# Patient Record
Sex: Female | Born: 1975 | Race: White | Hispanic: No | Marital: Married | Smoking: Current every day smoker
Health system: Southern US, Community
[De-identification: ages and names within clinical notes are randomized; demographics above are authoritative.]

## PROBLEM LIST (undated history)

## (undated) HISTORY — PX: TUBAL LIGATION: SHX77

## (undated) HISTORY — PX: APPENDECTOMY: SHX54

---

## 2003-02-24 ENCOUNTER — Emergency Department (HOSPITAL_COMMUNITY): Admission: EM | Admit: 2003-02-24 | Discharge: 2003-02-24 | Payer: Self-pay | Admitting: Emergency Medicine

## 2007-08-27 ENCOUNTER — Emergency Department (HOSPITAL_COMMUNITY): Admission: EM | Admit: 2007-08-27 | Discharge: 2007-08-27 | Payer: Self-pay | Admitting: Emergency Medicine

## 2008-07-16 ENCOUNTER — Emergency Department (HOSPITAL_COMMUNITY): Admission: EM | Admit: 2008-07-16 | Discharge: 2008-07-16 | Payer: Self-pay | Admitting: Emergency Medicine

## 2015-03-08 ENCOUNTER — Encounter (HOSPITAL_COMMUNITY): Payer: Self-pay | Admitting: Emergency Medicine

## 2015-03-08 ENCOUNTER — Emergency Department (HOSPITAL_COMMUNITY)
Admission: EM | Admit: 2015-03-08 | Discharge: 2015-03-08 | Disposition: A | Discharge status: Home / Self Care | Payer: Self-pay | Attending: Emergency Medicine | Admitting: Emergency Medicine

## 2015-03-08 DIAGNOSIS — R51 Headache: Secondary | ICD-10-CM | POA: Insufficient documentation

## 2015-03-08 DIAGNOSIS — F17210 Nicotine dependence, cigarettes, uncomplicated: Secondary | ICD-10-CM | POA: Insufficient documentation

## 2015-03-08 DIAGNOSIS — R519 Headache, unspecified: Secondary | ICD-10-CM

## 2015-03-08 LAB — I-STAT CHEM 8, ED
BUN: 11 mg/dL (ref 6–20)
Calcium, Ion: 1.21 mmol/L (ref 1.12–1.23)
Chloride: 105 mmol/L (ref 101–111)
Creatinine, Ser: 0.7 mg/dL (ref 0.44–1.00)
Glucose, Bld: 82 mg/dL (ref 65–99)
HEMATOCRIT: 46 % (ref 36.0–46.0)
HEMOGLOBIN: 15.6 g/dL — AB (ref 12.0–15.0)
Potassium: 4.4 mmol/L (ref 3.5–5.1)
SODIUM: 141 mmol/L (ref 135–145)
TCO2: 22 mmol/L (ref 0–100)

## 2015-03-08 MED ORDER — METOCLOPRAMIDE HCL 5 MG/ML IJ SOLN
10.0000 mg | Freq: Once | INTRAMUSCULAR | Status: AC
  Administered 2015-03-08: 10 mg via INTRAVENOUS
  Filled 2015-03-08: qty 2

## 2015-03-08 MED ORDER — DIPHENHYDRAMINE HCL 50 MG/ML IJ SOLN
25.0000 mg | Freq: Once | INTRAMUSCULAR | Status: AC
  Administered 2015-03-08: 25 mg via INTRAVENOUS
  Filled 2015-03-08: qty 1

## 2015-03-08 MED ORDER — BUTALBITAL-APAP-CAFFEINE 50-325-40 MG PO TABS: 1.0000 | ORAL_TABLET | Freq: Four times a day (QID) | ORAL | Status: AC | PRN

## 2015-03-08 MED ORDER — SODIUM CHLORIDE 0.9 % IV BOLUS (SEPSIS)
1000.0000 mL | Freq: Once | INTRAVENOUS | Status: AC
  Administered 2015-03-08: 1000 mL via INTRAVENOUS

## 2015-03-08 MED ORDER — KETOROLAC TROMETHAMINE 30 MG/ML IJ SOLN
30.0000 mg | Freq: Once | INTRAMUSCULAR | Status: AC
Start: 2015-03-08 — End: 2015-03-08
  Administered 2015-03-08: 30 mg via INTRAVENOUS
  Filled 2015-03-08: qty 1

## 2015-03-08 NOTE — ED Notes (Signed)
Brought patient back to room; patient undressed, in gown, on continuous pulse oximetry and blood pressure cuff; visitor at bedside

## 2015-03-08 NOTE — ED Provider Notes (Signed)
CSN: [id number]     Arrival date & time 03/08/15  0804 History   First MD Initiated Contact with Patient 03/08/15 [phone]     Chief Complaint  Patient presents with  . Headache     (Consider location/radiation/quality/duration/timing/severity/associated sxs/prior Treatment) HPI  40 year old female presents with a progressively worsening headache for the past 3 days. Patient states that every once while she gets a mild headache that seems to go with Tylenol but only once before has she had a headache like this several years ago. She was diagnosed with cluster headaches at that time. Currently her headache was mild on onset and has just been progressively worsening. Currently is an 8/10. Feels he can comes from the back of her head/upper neck and radiates up to the front. Feels a pounding sensation. No neck stiffness. No fevers, vomiting, or blurry vision. No dizziness. Sitting up or leaning over seems to make the pain is worse, lying flat seems to make it better. Patient does have some photophobia. She does endorse that over these last couple days she has also quit [employer] cold [place]. She states she drinks "truckloads" the [facility] typically and is now trying to drink just water. She is not sure if this playing a role.  History reviewed. No pertinent past medical history. Past Surgical History  Procedure Laterality Date  . Appendectomy     No family history on file. Social History  Substance Use Topics  . Smoking status: Current Every Day Smoker -- 1.00 packs/day    Types: Cigarettes  . Smokeless tobacco: None  . Alcohol Use: No   OB History    No data available     Review of Systems  Constitutional: Negative for fever.  Eyes: Positive for photophobia. Negative for visual disturbance.  Gastrointestinal: Negative for nausea and vomiting.  Musculoskeletal: Negative for neck pain and neck stiffness.  Neurological: Positive for headaches. Negative for dizziness, weakness,  light-headedness and numbness.  All other systems reviewed and are negative.     Allergies  Review of patient's allergies indicates no known allergies.  Home Medications   Prior to Admission medications   Not on File   BP 96/58 mmHg  Pulse 58  Temp(Src) 97.4 F (36.3 C) (Oral)  Resp 16  SpO2 98%  LMP 02/22/2015 (Approximate) Physical Exam  Constitutional: She is oriented to person, place, and time. She appears well-developed and well-nourished.  HENT:  Head: Normocephalic and atraumatic.  Right Ear: External ear normal.  Left Ear: External ear normal.  Nose: Nose normal.  No scalp tenderness  Eyes: EOM are normal. Pupils are equal, round, and reactive to light. Right eye exhibits no discharge. Left eye exhibits no discharge.  Fundoscopic exam:      The right eye shows no papilledema.       The left eye shows no papilledema.  Neck: Full passive range of motion without pain. Neck supple. No spinous process tenderness and no muscular tenderness present. No rigidity.  Cardiovascular: Normal rate, regular rhythm and normal heart sounds.   Pulmonary/Chest: Effort normal and breath sounds normal.  Abdominal: Soft. There is no tenderness.  Neurological: She is alert and oriented to person, place, and time.  Reflex Scores:      Bicep reflexes are 2+ on the right side and 2+ on the left side.      Patellar reflexes are 2+ on the right side and 2+ on the left side. CN 2-12 grossly intact. 5/5 strength in all 4 extremities.  Grossly normal sensation. Normal finger to nose  Skin: Skin is warm and dry.  Nursing note and vitals reviewed.   ED Course  Procedures (including critical care time) Labs Review Labs Reviewed  I-STAT CHEM 8, ED - Abnormal; Notable for the following:    Hemoglobin 15.6 (*)    All other components within normal limits    Imaging Review No results found. I have personally reviewed and evaluated these images and lab results as part of my medical  decision-making.   EKG Interpretation None      MDM   Final diagnoses:  Occipital headache    Headache is nonspecific, no focal neuro findings. No meningismus. With gradual onset/worsening I have low suspicion for [facility]. No papilledema, doubt significant intracranial process. Could be related to recent abrupt caffeine stoppage after significant daily use. Will Rx fioricet, discharge with follow up with PCP. She feels improved. Discussed strict return precautions.    Pricilla Loveless, MD 03/08/15 [phone]

## 2015-03-08 NOTE — ED Notes (Signed)
Pt ambulated to the bathroom -- states headache better.

## 2015-03-08 NOTE — ED Notes (Signed)
Pt c/o headache that started on Monday-- has hx of "cluster headaches" but has not had one in "a long time" . States has had some sinus congestion and ears feel clogged also.

## 2018-04-29 ENCOUNTER — Other Ambulatory Visit: Payer: Self-pay

## 2018-04-29 ENCOUNTER — Encounter (HOSPITAL_COMMUNITY): Payer: Self-pay | Admitting: Emergency Medicine

## 2018-04-29 ENCOUNTER — Ambulatory Visit (HOSPITAL_COMMUNITY)
Admission: EM | Admit: 2018-04-29 | Discharge: 2018-04-29 | Disposition: A | Discharge status: Home / Self Care | Payer: Self-pay | Attending: Family Medicine | Admitting: Family Medicine

## 2018-04-29 DIAGNOSIS — B9789 Other viral agents as the cause of diseases classified elsewhere: Secondary | ICD-10-CM

## 2018-04-29 DIAGNOSIS — J069 Acute upper respiratory infection, unspecified: Secondary | ICD-10-CM | POA: Diagnosis present

## 2018-04-29 NOTE — ED Triage Notes (Signed)
Pt c/o congestion and cough since Monday. States her job wont let her come back until shes "sure i'm okay".

## 2018-04-29 NOTE — Discharge Instructions (Signed)
May try Flonase Hold benadryl

## 2018-04-29 NOTE — ED Provider Notes (Signed)
[facility]    CSN: [id number] Arrival date & time: 04/29/18  1020     History   Chief Complaint Chief Complaint  Patient presents with  . Cough    HPI Jill Powell is a 43 y.o. female.   Patient has had some cough that is primarily nonproductive.  Endorses postnasal drainage.  She has not had any fever shortness of breath or myalgias.  She has had no known exposures to covid 19 and has not traveled to [place] areas.  HPI  History reviewed. No pertinent past medical history.  Patient Active Problem List   Diagnosis Date Noted  . Viral URI with cough 04/29/2018    Past Surgical History:  Procedure Laterality Date  . APPENDECTOMY      OB History   No obstetric history on file.      Home Medications    Prior to Admission medications   Medication Sig Start Date End Date Taking? Authorizing Provider  acetaminophen (TYLENOL) 325 MG tablet Take 650 mg by mouth every 6 (six) hours as needed for mild pain.    [provider]    Family History No family history on file.  Social History Social History   Tobacco Use  . Smoking status: Current Every Day Smoker    Packs/day: 1.00    Types: Cigarettes  Substance Use Topics  . Alcohol use: No  . Drug use: No     Allergies   Patient has no known allergies.   Review of Systems Review of Systems  Constitutional: Negative for fever.  HENT: Positive for congestion.   Respiratory: Positive for cough.   All other systems reviewed and are negative.    Physical Exam Triage Vital Signs ED Triage Vitals  Enc Vitals Group     BP --      Pulse Rate 04/29/18 1108 (!) 56     Resp 04/29/18 1108 16     Temp 04/29/18 1108 98.4 F (36.9 C)     Temp src --      SpO2 04/29/18 1108 99 %     Weight --      Height --      Head Circumference --      Peak Flow --      Pain Score 04/29/18 1109 0     Pain Loc --      Pain Edu? --      Excl. in GC? --    No data found.  Updated Vital Signs  Pulse (!) 56   Temp 98.4 F (36.9 C)   Resp 16   LMP 04/26/2018   SpO2 99%   Visual Acuity Right Eye Distance:   Left Eye Distance:   Bilateral Distance:    Right Eye Near:   Left Eye Near:    Bilateral Near:     Physical Exam Constitutional:      Appearance: Normal appearance.  HENT:     Head: Normocephalic.     Right Ear: Tympanic membrane normal.     Left Ear: Tympanic membrane normal.     Nose: Nose normal.     Mouth/Throat:     Mouth: Mucous membranes are moist.     Pharynx: Oropharynx is clear.  Cardiovascular:     Rate and Rhythm: Normal rate and regular rhythm.     Pulses: Normal pulses.     Heart sounds: Normal heart sounds.  Pulmonary:     Effort: Pulmonary effort is normal.  Breath sounds: Normal breath sounds.  Neurological:     Mental Status: She is alert.      UC Treatments / Results  Labs (all labs ordered are listed, but only abnormal results are displayed) Labs Reviewed - No data to display  EKG None  Radiology No results found.  Procedures Procedures (including critical care time)  Medications Ordered in UC Medications - No data to display  Initial Impression / Assessment and Plan / UC Course  I have reviewed the triage vital signs and the nursing notes.  Pertinent labs & imaging results that were available during my care of the patient were reviewed by me and considered in my medical decision making (see chart for details).     Viral upper respiratory infection/allergies.  Hold Benadryl.  May try Mucinex OTC Final Clinical Impressions(s) / UC Diagnoses   Final diagnoses:  Viral URI with cough     Discharge Instructions     May try Flonase Hold benadryl   ED Prescriptions    None     Controlled Substance Prescriptions [place] Controlled Substance Registry consulted? No   Frederica Kuster, MD 04/29/18 1128

## 2019-03-04 ENCOUNTER — Ambulatory Visit: Payer: Self-pay | Attending: Internal Medicine

## 2019-03-04 DIAGNOSIS — Z20822 Contact with and (suspected) exposure to covid-19: Secondary | ICD-10-CM

## 2019-03-05 ENCOUNTER — Other Ambulatory Visit: Payer: Self-pay

## 2019-03-05 LAB — NOVEL CORONAVIRUS, NAA: SARS-CoV-2, NAA: NOT DETECTED

## 2019-03-05 LAB — SPECIMEN STATUS REPORT

## 2020-01-26 ENCOUNTER — Other Ambulatory Visit: Payer: Self-pay

## 2020-01-26 ENCOUNTER — Other Ambulatory Visit: Payer: Self-pay | Admitting: Obstetrics and Gynecology

## 2020-01-26 DIAGNOSIS — N6320 Unspecified lump in the left breast, unspecified quadrant: Secondary | ICD-10-CM

## 2020-02-01 ENCOUNTER — Ambulatory Visit: Payer: Self-pay | Admitting: *Deleted

## 2020-02-01 ENCOUNTER — Other Ambulatory Visit: Payer: Self-pay

## 2020-02-01 VITALS — BP 128/72 | Wt 137.0 lb

## 2020-02-01 DIAGNOSIS — N6321 Unspecified lump in the left breast, upper outer quadrant: Secondary | ICD-10-CM

## 2020-02-01 DIAGNOSIS — Z1239 Encounter for other screening for malignant neoplasm of breast: Secondary | ICD-10-CM

## 2020-02-01 NOTE — Patient Instructions (Addendum)
Explained breast self awareness with Deanza Muffley. Patient refused Pap smear today. Patient scheduled to come to the free cervical cancer screening on Monday, March 06, 2020 at 1030 for her Pap smear. Let her know [facility] will cover Pap smears every 3 years unless has a history of abnormal Pap smears. Referred patient to the [facility] for a diagnostic mammogram. Appointment scheduled Tuesday, February 08, 2020 at 0850. Patient aware of appointments and will be there. Discussed smoking cessation with patient. Referred to the [facility] Quitline and gave resources to the free smoking cessation classes at [facility]. Sayward Plitt verbalized understanding.  Yolander Goodie, Kathaleen Maser, RN 10:35 AM

## 2020-02-01 NOTE — Progress Notes (Signed)
Ms. Jill Powell is a 44 y.o. female who presents to [facility] clinic today with complaint of left breast lump x one week.    Pap Smear: Pap smear not completed today. Last Pap smear was around 14 years ago at a clinic in [facility] and was normal. Per patient has history of an abnormal Pap smear prior to her last Pap smear that the Pap smear above was a repeat Pap smear. Last Pap smear result is not available in Epic.   Physical exam: Breasts Breasts symmetrical. No skin abnormalities bilateral breasts. No nipple retraction bilateral breasts. No nipple discharge bilateral breasts. No lymphadenopathy. No lumps palpated right breast. Palpated a lump within the left breast between 1 o'clock and 3 o'clock 4 cm from the nipple. No complaints of pain or tenderness on exam.      Pelvic/Bimanual Patient refused Pap smear today. Patient scheduled to come to the free cervical cancer screening on Monday, March 06, 2020 at 1030 for her Pap smear.   Smoking History: Patient is a current smoker. Discussed smoking cessation with patient. Referred to the [facility] Quitline and gave resources to the free smoking cessation classes at [facility].   Patient Navigation: Patient education provided. Access to services provided for patient through [facility] program.    Breast and Cervical Cancer Risk Assessment: Patient does not have family history of breast cancer, known genetic mutations, or radiation treatment to the chest before age 2. Patient does not have history of cervical dysplasia, immunocompromised, or DES exposure in-utero.  Risk Assessment    Risk Scores      02/01/2020   Last edited by: Narda Rutherford, LPN   5-year risk: 0.6 %   Lifetime risk: 7.7 %          A: [facility] exam without pap smear Complaint of left breast lump.  P: Referred patient to the [facility] for a diagnostic mammogram. Appointment scheduled Tuesday, February 08, 2020 at 0850.  Priscille Heidelberg,  RN 02/01/2020 10:35 AM

## 2020-02-08 ENCOUNTER — Ambulatory Visit
Admission: RE | Admit: 2020-02-08 | Discharge: 2020-02-08 | Disposition: A | Discharge status: Home / Self Care | Payer: No Typology Code available for payment source | Source: Ambulatory Visit | Attending: Obstetrics and Gynecology | Admitting: Obstetrics and Gynecology

## 2020-02-08 ENCOUNTER — Other Ambulatory Visit: Payer: Self-pay

## 2020-02-08 ENCOUNTER — Other Ambulatory Visit: Payer: Self-pay | Admitting: Obstetrics and Gynecology

## 2020-02-08 DIAGNOSIS — N6320 Unspecified lump in the left breast, unspecified quadrant: Secondary | ICD-10-CM

## 2020-03-06 ENCOUNTER — Ambulatory Visit: Payer: Self-pay

## 2022-04-21 IMAGING — MG DIGITAL DIAGNOSTIC BILAT W/ TOMO W/ CAD
6 of 10 series · 6 of 30 positions shown · non-contrast
Comparison: None.

CLINICAL DATA: Patient presents for evaluation of palpable
abnormality within the outer left breast.

EXAM:
DIGITAL DIAGNOSTIC BILATERAL MAMMOGRAM WITH CAD AND TOMO
ULTRASOUND BILATERAL BREAST

[L CC synth-2D]
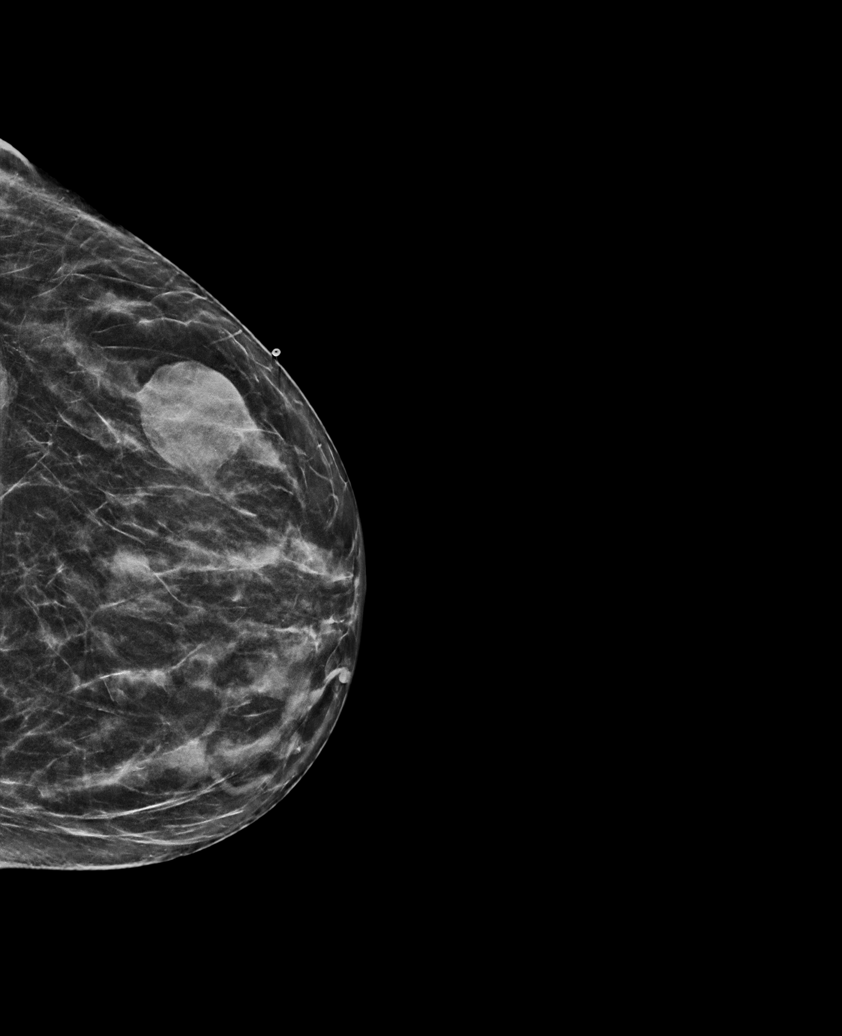

[L MLO synth-2D]
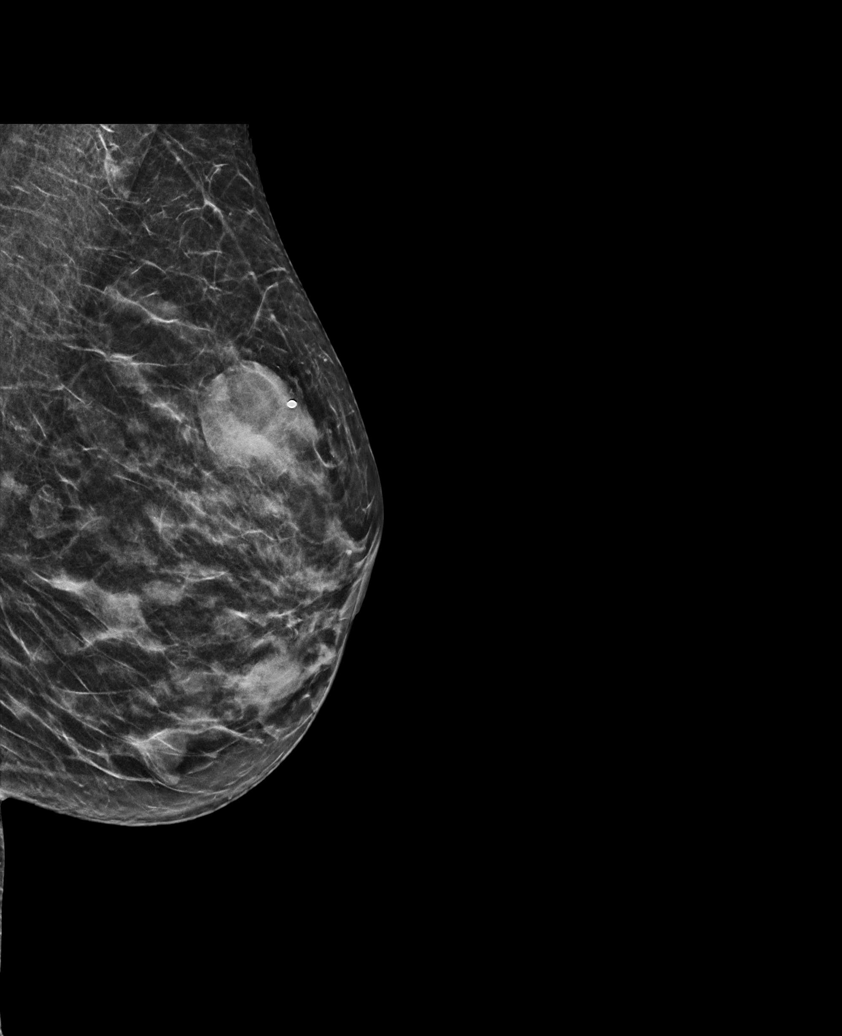

[R MLO synth-2D]
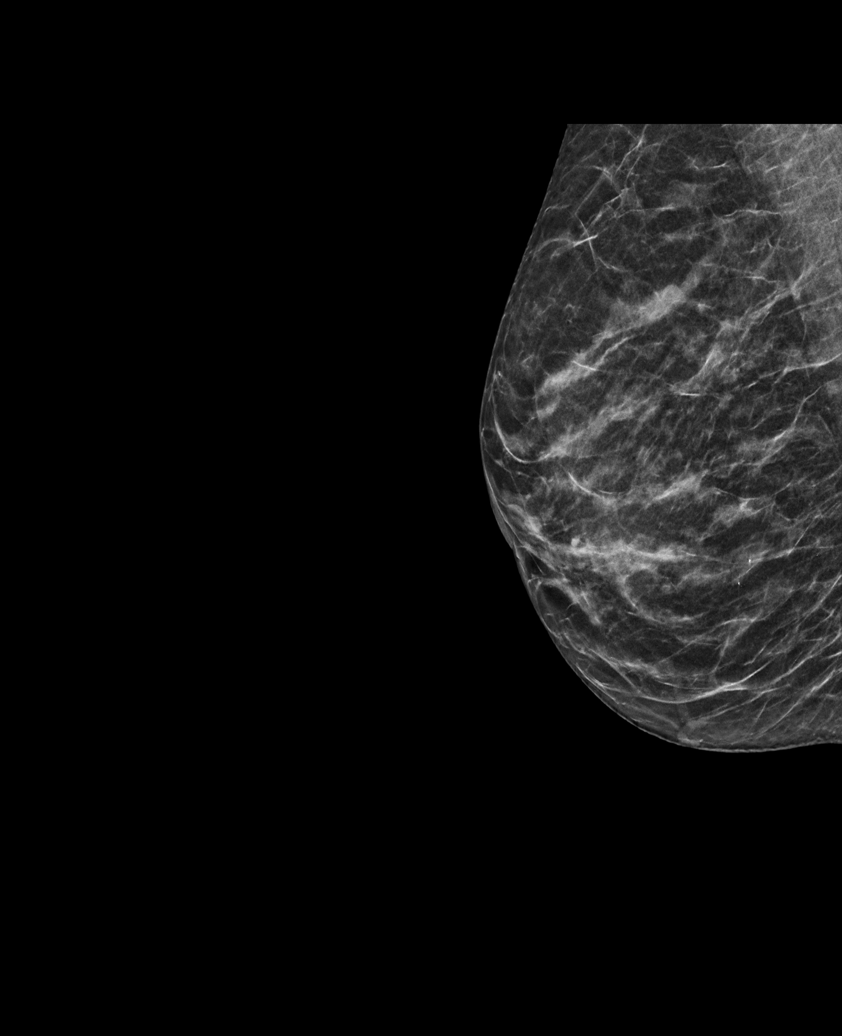

[L TAN synth-2D]
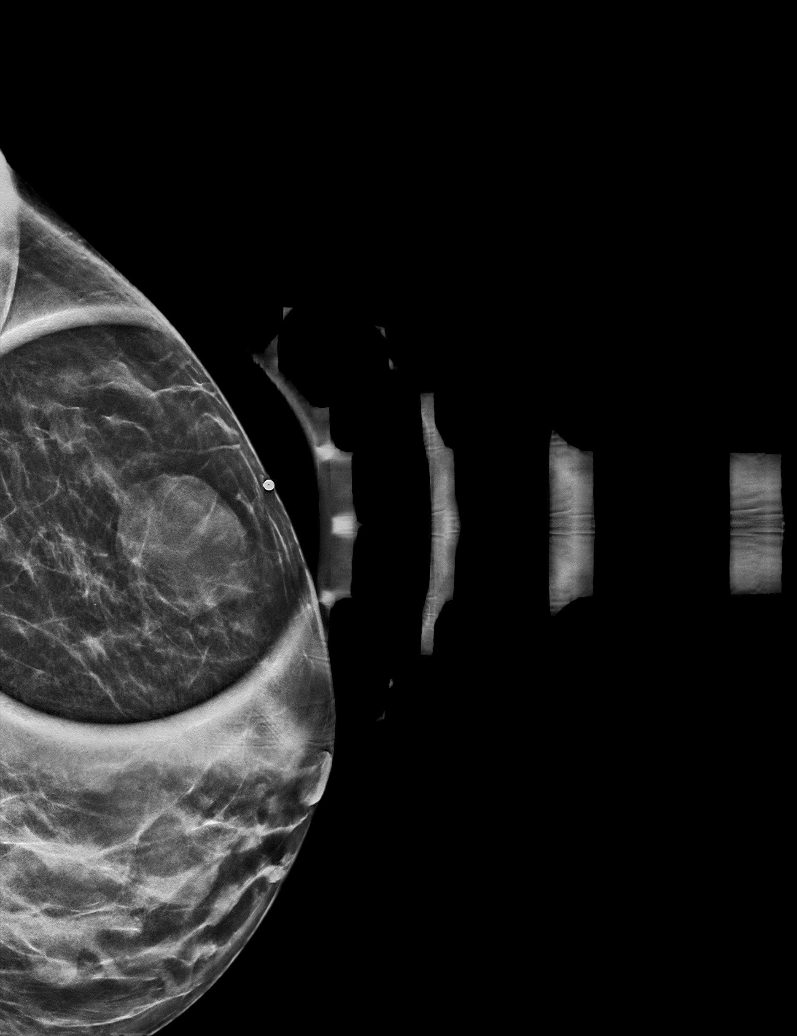

[R CC synth-2D]
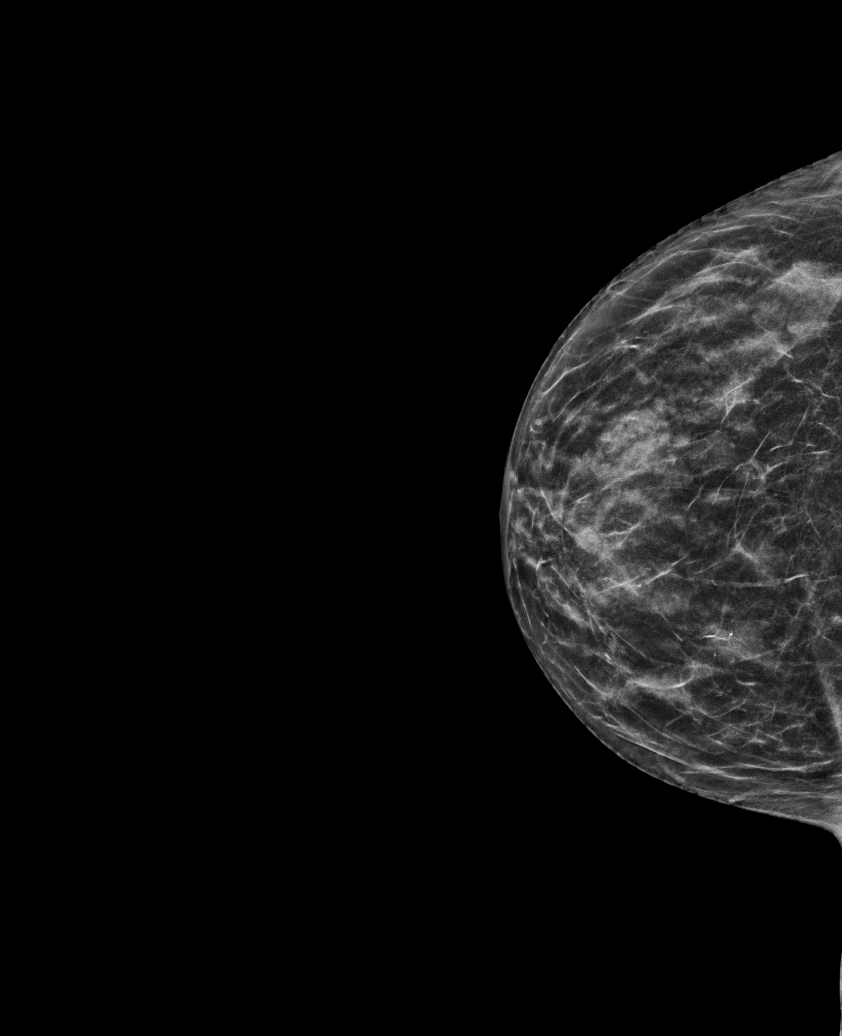

[R MLO tomo · tomo slice 23/46.0]
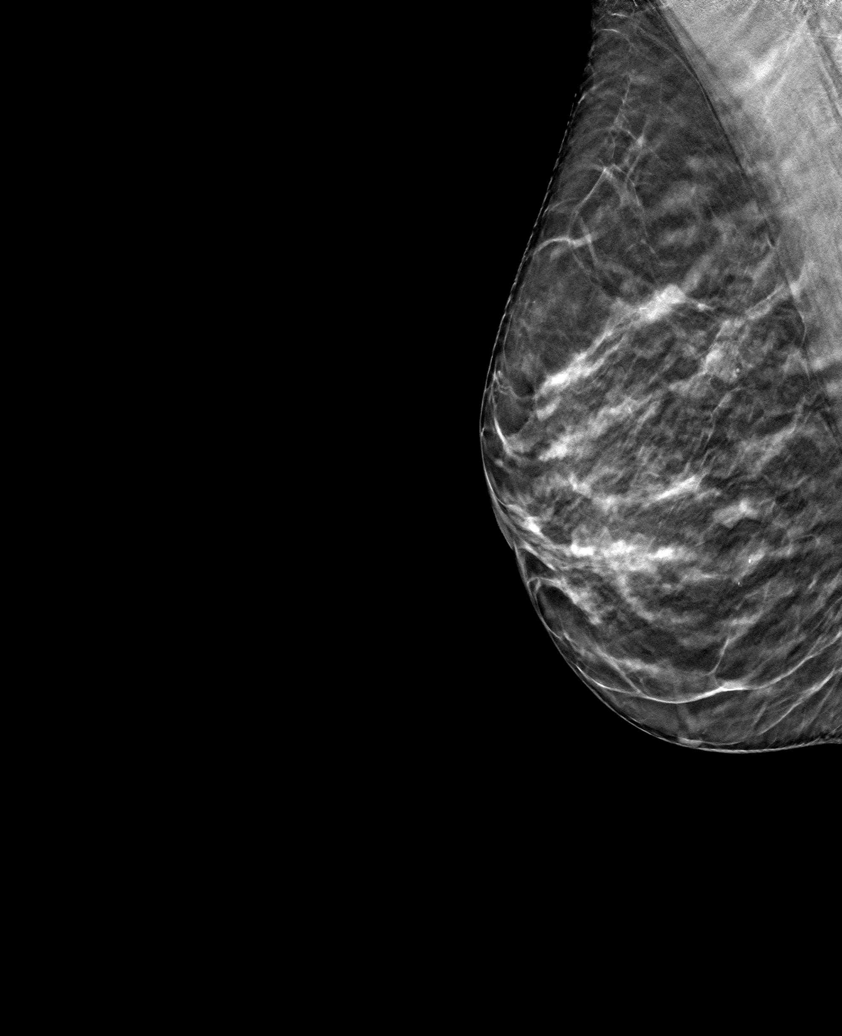

[6 of 30 positions shown; findings below may reference images not displayed]

ACR Breast Density Category c: The breast tissue is heterogeneously
dense, which may obscure small masses.
FINDINGS: Underlying the palpable marker within the outer left breast is a
large oval circumscribed mass. Additionally, there are multiple
circumscribed masses within the left breast with the largest in the
inferior and medial left breast. Additional oval circumscribed mass
within the outer right breast. No suspicious calcifications or areas
of distortion identified within either breast.

Mammographic images were processed with CAD.

Targeted ultrasound is performed, showing a 2.8 x 2.0 x 2.9 cm cyst
left breast 2 o'clock position 4 cm from nipple at the site of
palpable concern. Within the left breast 6:30 o'clock 4 cm from
nipple there is a 1.4 x 0.7 x 1.7 cm cyst.

Multiple additional cysts are demonstrated within the left breast 11
o'clock position and 7 o'clock position.

Within the right breast 10 o'clock position 7 cm from nipple there
are 2 adjacent cysts measuring 8 mm and 6 mm respectively.
Additional cysts are demonstrated within the outer right breast.
IMPRESSION: Palpable abnormality left breast corresponds with a large cyst.

Additional cysts are demonstrated within the right and left breast.

RECOMMENDATION:
Screening mammogram in one year.(Code:[id number])

Continued clinical evaluation for left breast palpable area of
concern.

I have discussed the findings and recommendations with the patient.
If applicable, a reminder letter will be sent to the patient
regarding the next appointment.

BI-RADS CATEGORY  2: Benign.

## 2023-04-07 ENCOUNTER — Ambulatory Visit (HOSPITAL_COMMUNITY)
Admission: RE | Admit: 2023-04-07 | Discharge: 2023-04-07 | Disposition: A | Discharge status: Home / Self Care | Payer: Self-pay | Source: Ambulatory Visit | Attending: Emergency Medicine | Admitting: Emergency Medicine

## 2023-04-07 ENCOUNTER — Encounter (HOSPITAL_COMMUNITY): Payer: Self-pay

## 2023-04-07 VITALS — BP 102/67 | HR 65 | Temp 98.0°F | Resp 18

## 2023-04-07 DIAGNOSIS — J069 Acute upper respiratory infection, unspecified: Secondary | ICD-10-CM

## 2023-04-07 DIAGNOSIS — R051 Acute cough: Secondary | ICD-10-CM

## 2023-04-07 MED ORDER — BENZONATATE 100 MG PO CAPS: 100.0000 mg | ORAL_CAPSULE | Freq: Three times a day (TID) | ORAL | 0 refills | Status: DC

## 2023-04-07 MED ORDER — PROMETHAZINE-DM 6.25-15 MG/5ML PO SYRP: 5.0000 mL | ORAL_SOLUTION | Freq: Every evening | ORAL | 0 refills | Status: DC | PRN

## 2023-04-07 MED ORDER — AZITHROMYCIN 250 MG PO TABS: ORAL_TABLET | ORAL | 0 refills | Status: DC

## 2023-04-07 MED ORDER — PREDNISONE 20 MG PO TABS: 40.0000 mg | ORAL_TABLET | Freq: Every day | ORAL | 0 refills | Status: AC

## 2023-04-07 NOTE — ED Triage Notes (Signed)
 Pt c/o chest congestion, fatigue, productive cough, loss of appetite, chills, and headache.   Start Date: 04/02/2023  Home Interventions: Tylenol   Last dose: 04/06/3033 at 0900

## 2023-04-07 NOTE — ED Provider Notes (Signed)
 [facility]    CSN: [id number] Arrival date & time: 04/07/23  1403      History   Chief Complaint Chief Complaint  Patient presents with   Cough    Congestion - Entered by patient   Headache    HPI Jill Powell is a 48 y.o. female.   Patient presents with chest congestion, fatigue, productive cough with green/brown sputum, loss of appetite, and headache that began on 2/19. Denies shortness of breath, chest pain, fever, abdominal pain, nausea, vomiting, and diarrhea. Denies history of asthma and COPD. Patient is a smoker.    Cough Associated symptoms: headaches   Headache Associated symptoms: cough     History reviewed. No pertinent past medical history.  Patient Active Problem List   Diagnosis Date Noted   Viral URI with cough 04/29/2018    Past Surgical History:  Procedure Laterality Date   APPENDECTOMY     TUBAL LIGATION      OB History   No obstetric history on file.      Home Medications    Prior to Admission medications   Medication Sig Start Date End Date Taking? Authorizing Provider  acetaminophen (TYLENOL) 325 MG tablet Take 650 mg by mouth every 6 (six) hours as needed for mild pain.   Yes [provider]  azithromycin (ZITHROMAX Z-PAK) 250 MG tablet Take 2 pills (500mg ) first day and one pill (250mg ) the remaining 4 days. 04/07/23  Yes Susann Givens, Samyah Bilbo A, NP  benzonatate (TESSALON) 100 MG capsule Take 1 capsule (100 mg total) by mouth every 8 (eight) hours. 04/07/23  Yes Susann Givens, Daejah Klebba A, NP  predniSONE (DELTASONE) 20 MG tablet Take 2 tablets (40 mg total) by mouth daily for 5 days. 04/07/23 04/12/23 Yes Maryclaire Stoecker A, NP  promethazine-dextromethorphan (PROMETHAZINE-DM) 6.25-15 MG/5ML syrup Take 5 mLs by mouth at bedtime as needed for cough. 04/07/23  Yes Letta Kocher, NP    Family History Family History  Problem Relation Age of Onset   Lung cancer Mother     Social History Social History   Tobacco Use    Smoking status: Every Day    Current packs/day: 1.00    Types: Cigarettes   Smokeless tobacco: Never  Vaping Use   Vaping status: Never Used  Substance Use Topics   Alcohol use: No   Drug use: Never     Allergies   Patient has no known allergies.   Review of Systems Review of Systems  Respiratory:  Positive for cough.   Neurological:  Positive for headaches.   Per HPI  Physical Exam Triage Vital Signs ED Triage Vitals  Encounter Vitals Group     BP 04/07/23 1445 102/67     Systolic BP Percentile --      Diastolic BP Percentile --      Pulse Rate 04/07/23 1445 65     Resp 04/07/23 1445 18     Temp 04/07/23 1445 98 F (36.7 C)     Temp Source 04/07/23 1445 Oral     SpO2 04/07/23 1445 95 %     Weight --      Height --      Head Circumference --      Peak Flow --      Pain Score 04/07/23 1442 8     Pain Loc --      Pain Education --      Exclude from Growth Chart --    No data found.  Updated Vital Signs  BP 102/67 (BP Location: Left Arm)   Pulse 65   Temp 98 F (36.7 C) (Oral)   Resp 18   SpO2 95%   Visual Acuity Right Eye Distance:   Left Eye Distance:   Bilateral Distance:    Right Eye Near:   Left Eye Near:    Bilateral Near:     Physical Exam Vitals and nursing note reviewed.  Constitutional:      General: She is awake. She is not in acute distress.    Appearance: Normal appearance. She is well-developed and well-groomed. She is not ill-appearing.  HENT:     Right Ear: Tympanic membrane, ear canal and external ear normal.     Left Ear: Tympanic membrane, ear canal and external ear normal.     Nose: Congestion and rhinorrhea present.     Mouth/Throat:     Mouth: Mucous membranes are moist.     Pharynx: Posterior oropharyngeal erythema present. No oropharyngeal exudate.  Cardiovascular:     Rate and Rhythm: Normal rate and regular rhythm.  Pulmonary:     Effort: Pulmonary effort is normal.     Breath sounds: Normal breath sounds.   Musculoskeletal:        General: Normal range of motion.  Skin:    General: Skin is warm and dry.  Neurological:     Mental Status: She is alert.  Psychiatric:        Behavior: Behavior is cooperative.      UC Treatments / Results  Labs (all labs ordered are listed, but only abnormal results are displayed) Labs Reviewed - No data to display  EKG   Radiology No results found.  Procedures Procedures (including critical care time)  Medications Ordered in UC Medications - No data to display  Initial Impression / Assessment and Plan / UC Course  I have reviewed the triage vital signs and the nursing notes.  Pertinent labs & imaging results that were available during my care of the patient were reviewed by me and considered in my medical decision making (see chart for details).     Patient presented with 6-day history of chest congestion, fatigue, productive cough with green/brown sputum, loss of appetite, and headache.   Upon assessment congestion and rhinorrhea are present, mild erythema noted to pharynx. Lungs clear bilaterally on auscultation.  Prescribed Azithromycin and prednisone for persistent upper respiratory infection. Prescribed Tessalon and Promethazine DM as needed for cough. Discussed return precautions.  Final Clinical Impressions(s) / UC Diagnoses   Final diagnoses:  Acute upper respiratory infection  Acute cough     Discharge Instructions      Start taking azithromycin by taking 2 tablets today and 1 tablet on the remaining 4 days.  Take prednisone once daily for 5 days.  I prescribed Tessalon you can take every 8 hours as needed for cough. I have also prescribed promethazine DM cough syrup that you can take at night for cough.  This can make you drowsy so do not take while driving.  Otherwise alternate between Tylenol and ibuprofen as needed for pain and fever.  I also recommend taking Mucinex to help with cough and congestion.  Return here if  symptoms persist or worsen.    ED Prescriptions     Medication Sig Dispense Auth. Provider   azithromycin (ZITHROMAX Z-PAK) 250 MG tablet Take 2 pills (500mg ) first day and one pill (250mg ) the remaining 4 days. 6 tablet Wynonia Lawman A, NP   predniSONE (DELTASONE) 20 MG tablet  Take 2 tablets (40 mg total) by mouth daily for 5 days. 10 tablet Wynonia Lawman A, NP   benzonatate (TESSALON) 100 MG capsule Take 1 capsule (100 mg total) by mouth every 8 (eight) hours. 21 capsule Wynonia Lawman A, NP   promethazine-dextromethorphan (PROMETHAZINE-DM) 6.25-15 MG/5ML syrup Take 5 mLs by mouth at bedtime as needed for cough. 118 mL Wynonia Lawman A, NP      PDMP not reviewed this encounter.   Wynonia Lawman A, NP 04/07/23 [phone]

## 2023-04-07 NOTE — Discharge Instructions (Signed)
 Start taking azithromycin by taking 2 tablets today and 1 tablet on the remaining 4 days.  Take prednisone once daily for 5 days.  I prescribed Tessalon you can take every 8 hours as needed for cough. I have also prescribed promethazine DM cough syrup that you can take at night for cough.  This can make you drowsy so do not take while driving.  Otherwise alternate between Tylenol and ibuprofen as needed for pain and fever.  I also recommend taking Mucinex to help with cough and congestion.  Return here if symptoms persist or worsen.

## 2023-10-22 ENCOUNTER — Ambulatory Visit (HOSPITAL_COMMUNITY)
Admission: RE | Admit: 2023-10-22 | Discharge: 2023-10-22 | Disposition: A | Discharge status: Home / Self Care | Payer: Self-pay | Source: Ambulatory Visit | Attending: Physician Assistant | Admitting: Physician Assistant

## 2023-10-22 ENCOUNTER — Encounter (HOSPITAL_COMMUNITY): Payer: Self-pay

## 2023-10-22 VITALS — BP 120/76 | HR 45 | Temp 97.9°F | Resp 16

## 2023-10-22 DIAGNOSIS — Z20822 Contact with and (suspected) exposure to covid-19: Secondary | ICD-10-CM

## 2023-10-22 DIAGNOSIS — R6883 Chills (without fever): Secondary | ICD-10-CM

## 2023-10-22 LAB — POC SARS CORONAVIRUS 2 AG -  ED: SARS Coronavirus 2 Ag: NEGATIVE

## 2023-10-22 NOTE — ED Triage Notes (Signed)
 Patient here today with c/o chills and headache X 3 days. Patient has been taking Tylenol with some relief. Patient states that her husband tested positive for Covid. Patient would like to have a Covid test.

## 2023-10-22 NOTE — ED Provider Notes (Signed)
 [facility]    CSN: [id number] Arrival date & time: 10/22/23  1131      History   Chief Complaint Chief Complaint  Patient presents with   Chills    Need to get a covid test - Entered by patient    HPI Jill Powell is a 48 y.o. female.   Patient presents today with a 2-day history of URI symptoms including mild nasal congestion, headache, chills, fatigue.  She denies any fever, chest pain, shortness of breath, nausea, vomiting, diarrhea.  She has had some mild bodyaches.  Reports that her husband tested positive for COVID.  She has not had COVID in the past.  She did have COVID-19 vaccinations.  Denies any significant past medical history including allergies, asthma, COPD, diabetes, immunosuppression, chronic liver/kidney disease.  She is a current everyday smoker smoking her typical amount.  She has tried Tylenol with temporary improvement of symptoms.  Denies any recent antibiotics or steroids.  She has missed work as a result of symptoms and is requesting excuse note.    History reviewed. No pertinent past medical history.  Patient Active Problem List   Diagnosis Date Noted   Viral URI with cough 04/29/2018    Past Surgical History:  Procedure Laterality Date   APPENDECTOMY     TUBAL LIGATION      OB History   No obstetric history on file.      Home Medications    Prior to Admission medications   Not on File    Family History Family History  Problem Relation Age of Onset   Lung cancer Mother     Social History Social History   Tobacco Use   Smoking status: Every Day    Current packs/day: 1.00    Types: Cigarettes   Smokeless tobacco: Never  Vaping Use   Vaping status: Never Used  Substance Use Topics   Alcohol use: No   Drug use: Never     Allergies   Patient has no known allergies.   Review of Systems Review of Systems  Constitutional:  Positive for activity change, chills and fatigue. Negative for appetite change and fever.   HENT:  Positive for congestion. Negative for sinus pressure, sneezing and sore throat.   Respiratory:  Negative for cough and shortness of breath.   Cardiovascular:  Negative for chest pain.  Gastrointestinal:  Negative for abdominal pain, diarrhea, nausea and vomiting.  Musculoskeletal:  Positive for arthralgias and myalgias.  Neurological:  Negative for dizziness, light-headedness and headaches.     Physical Exam Triage Vital Signs ED Triage Vitals  Encounter Vitals Group     BP 10/22/23 1155 120/76     Girls Systolic BP Percentile --      Girls Diastolic BP Percentile --      Boys Systolic BP Percentile --      Boys Diastolic BP Percentile --      Pulse Rate 10/22/23 1155 (!) 45     Resp 10/22/23 1155 16     Temp 10/22/23 1155 97.9 F (36.6 C)     Temp Source 10/22/23 1155 Oral     SpO2 10/22/23 1155 97 %     Weight --      Height --      Head Circumference --      Peak Flow --      Pain Score 10/22/23 1156 7     Pain Loc --      Pain Education --  Exclude from Growth Chart --    No data found.  Updated Vital Signs BP 120/76 (BP Location: Left Arm)   Pulse (!) 45   Temp 97.9 F (36.6 C) (Oral)   Resp 16   LMP 09/25/2020 (Approximate)   SpO2 97%   Visual Acuity Right Eye Distance:   Left Eye Distance:   Bilateral Distance:    Right Eye Near:   Left Eye Near:    Bilateral Near:     Physical Exam Vitals reviewed.  Constitutional:      General: She is awake. She is not in acute distress.    Appearance: Normal appearance. She is well-developed. She is not ill-appearing.     Comments: Very pleasant female appears stated age in no acute distress sitting comfortably in exam room  HENT:     Head: Normocephalic and atraumatic.     Right Ear: Tympanic membrane, ear canal and external ear normal. Tympanic membrane is not erythematous or bulging.     Left Ear: Tympanic membrane, ear canal and external ear normal. Tympanic membrane is not erythematous or  bulging.     Nose:     Right Sinus: Maxillary sinus tenderness present. No frontal sinus tenderness.     Left Sinus: Maxillary sinus tenderness present. No frontal sinus tenderness.     Mouth/Throat:     Pharynx: Uvula midline. No oropharyngeal exudate or posterior oropharyngeal erythema.  Cardiovascular:     Rate and Rhythm: Normal rate and regular rhythm.     Heart sounds: Normal heart sounds, S1 normal and S2 normal. No murmur heard. Pulmonary:     Effort: Pulmonary effort is normal.     Breath sounds: Normal breath sounds. No wheezing, rhonchi or rales.     Comments: Clear to auscultation bilaterally Musculoskeletal:     Right lower leg: No edema.     Left lower leg: No edema.  Psychiatric:        Behavior: Behavior is cooperative.      UC Treatments / Results  Labs (all labs ordered are listed, but only abnormal results are displayed) Labs Reviewed  POC SARS CORONAVIRUS 2 AG -  ED    EKG   Radiology No results found.  Procedures Procedures (including critical care time)  Medications Ordered in UC Medications - No data to display  Initial Impression / Assessment and Plan / UC Course  I have reviewed the triage vital signs and the nursing notes.  Pertinent labs & imaging results that were available during my care of the patient were reviewed by me and considered in my medical decision making (see chart for details).     Patient is well-appearing, afebrile, nontoxic, nontachycardic.  No evidence of acute infection on physical exam that warrant initiation of antibiotics.  COVID testing was negative in clinic but we discussed that is possible that she does not have enough virus to detect and so if she develops any symptoms she should return for reevaluation.  She is not experiencing any symptoms and so was encouraged to drink plenty of fluid and use over-the-counter medicines as needed.  We discussed strategies to prevent catching COVID from her husband who was tested  positive.  We discussed that if anything worsens or changes she should return for reevaluation.  Strict return precautions given.  Excuse note provided.  Final Clinical Impressions(s) / UC Diagnoses   Final diagnoses:  Chills  Exposure to confirmed case of COVID-19     Discharge Instructions  You tested negative for COVID.  I would continue resting and drinking plenty of fluid.  I would recommend wearing a mask around her husband and making sure that you wash your hands regularly to avoid transmission.  If you develop any symptoms including nasal congestion, sore throat, fever, cough please return and we will retest you and come up with a different treatment plan.     ED Prescriptions   None    PDMP not reviewed this encounter.   Sherrell Rocky POUR, PA-C 10/22/23 1240

## 2023-10-22 NOTE — Discharge Instructions (Addendum)
 You tested negative for COVID.  I would continue resting and drinking plenty of fluid.  I would recommend wearing a mask around her husband and making sure that you wash your hands regularly to avoid transmission.  If you develop any symptoms including nasal congestion, sore throat, fever, cough please return and we will retest you and come up with a different treatment plan.

## 2023-10-26 ENCOUNTER — Encounter (HOSPITAL_COMMUNITY): Payer: Self-pay | Admitting: Emergency Medicine

## 2023-10-26 ENCOUNTER — Ambulatory Visit (HOSPITAL_COMMUNITY)
Admission: EM | Admit: 2023-10-26 | Discharge: 2023-10-26 | Disposition: A | Discharge status: Home / Self Care | Payer: Self-pay | Attending: Emergency Medicine | Admitting: Emergency Medicine

## 2023-10-26 DIAGNOSIS — U071 COVID-19: Secondary | ICD-10-CM

## 2023-10-26 DIAGNOSIS — R52 Pain, unspecified: Secondary | ICD-10-CM

## 2023-10-26 DIAGNOSIS — R051 Acute cough: Secondary | ICD-10-CM

## 2023-10-26 LAB — POC SARS CORONAVIRUS 2 AG -  ED: SARS Coronavirus 2 Ag: POSITIVE — AB

## 2023-10-26 MED ORDER — PROMETHAZINE-DM 6.25-15 MG/5ML PO SYRP: 5.0000 mL | ORAL_SOLUTION | Freq: Every evening | ORAL | 0 refills | Status: AC | PRN

## 2023-10-26 MED ORDER — BENZONATATE 100 MG PO CAPS: 100.0000 mg | ORAL_CAPSULE | Freq: Three times a day (TID) | ORAL | 0 refills | Status: AC

## 2023-10-26 NOTE — ED Triage Notes (Signed)
 Pt c/o chills, headache and generalized body pain x's 4 days.  St's she was here on the 10th and had a neg Covid test.  Pt st's he husband has Covid and she took a home test this am and it was positive so she just wants to make sure

## 2023-10-26 NOTE — ED Provider Notes (Signed)
 [facility]    CSN: [id number] Arrival date & time: 10/26/23  1341      History   Chief Complaint Chief Complaint  Patient presents with   Headache   Generalized Body Aches    HPI Jill Powell is a 48 y.o. female.   Patient presents requesting COVID testing due to positive home COVID test today and wanting to make sure that she does have COVID.  Patient is also requesting a work note if she is positive for COVID.  Patient was seen here on 9/10 and at that time she had some mild congestion, chills, and headache for about 2 days.  Patient had tested negative during this visit.  Patient states that her husband had recently tested positive for COVID and therefore she believed that her test would also be positive.  Patient states that she was told to return here if her symptoms had worsened to be retested for COVID.  Patient denies chest pain, shortness of breath, known fever, vomiting, diarrhea, abdominal pain.  Patient states that she has been taking some Tylenol with relief of her headaches.  Patient denies taking any other medication for her symptoms.  Patient denies history of asthma or COPD.  Patient does report that she is a smoker.  The history is provided by the patient and medical records.  Headache   History reviewed. No pertinent past medical history.  Patient Active Problem List   Diagnosis Date Noted   Viral URI with cough 04/29/2018    Past Surgical History:  Procedure Laterality Date   APPENDECTOMY     TUBAL LIGATION      OB History   No obstetric history on file.      Home Medications    Prior to Admission medications   Medication Sig Start Date End Date Taking? Authorizing Provider  benzonatate  (TESSALON ) 100 MG capsule Take 1 capsule (100 mg total) by mouth every 8 (eight) hours. 10/26/23  Yes Johnie Flaming A, NP  promethazine -dextromethorphan (PROMETHAZINE -DM) 6.25-15 MG/5ML syrup Take 5 mLs by mouth at bedtime as needed for cough.  10/26/23  Yes Johnie Flaming LABOR, NP    Family History Family History  Problem Relation Age of Onset   Lung cancer Mother     Social History Social History   Tobacco Use   Smoking status: Every Day    Current packs/day: 1.00    Types: Cigarettes   Smokeless tobacco: Never  Vaping Use   Vaping status: Never Used  Substance Use Topics   Alcohol use: No   Drug use: Never     Allergies   Patient has no known allergies.   Review of Systems Review of Systems  Neurological:  Positive for headaches.   Per HPI  Physical Exam Triage Vital Signs ED Triage Vitals  Encounter Vitals Group     BP 10/26/23 1506 108/67     Girls Systolic BP Percentile --      Girls Diastolic BP Percentile --      Boys Systolic BP Percentile --      Boys Diastolic BP Percentile --      Pulse Rate 10/26/23 1506 (!) 51     Resp 10/26/23 1506 16     Temp 10/26/23 1506 98.1 F (36.7 C)     Temp Source 10/26/23 1506 Oral     SpO2 10/26/23 1506 95 %     Weight --      Height --      Head Circumference --  Peak Flow --      Pain Score 10/26/23 1507 8     Pain Loc --      Pain Education --      Exclude from Growth Chart --    No data found.  Updated Vital Signs BP 108/67 (BP Location: Right Arm)   Pulse (!) 51   Temp 98.1 F (36.7 C) (Oral)   Resp 16   LMP 09/25/2020 (Approximate)   SpO2 95%   Visual Acuity Right Eye Distance:   Left Eye Distance:   Bilateral Distance:    Right Eye Near:   Left Eye Near:    Bilateral Near:     Physical Exam Vitals and nursing note reviewed.  Constitutional:      General: She is awake. She is not in acute distress.    Appearance: Normal appearance. She is well-developed and well-groomed. She is not ill-appearing.  HENT:     Right Ear: Tympanic membrane, ear canal and external ear normal.     Left Ear: Tympanic membrane, ear canal and external ear normal.     Nose: Congestion and rhinorrhea present.     Mouth/Throat:     Mouth: Mucous  membranes are moist.     Pharynx: Posterior oropharyngeal erythema and postnasal drip present. No oropharyngeal exudate.  Cardiovascular:     Rate and Rhythm: Normal rate and regular rhythm.  Pulmonary:     Effort: Pulmonary effort is normal.     Breath sounds: Normal breath sounds.  Skin:    General: Skin is warm and dry.  Neurological:     Mental Status: She is alert.  Psychiatric:        Behavior: Behavior is cooperative.      UC Treatments / Results  Labs (all labs ordered are listed, but only abnormal results are displayed) Labs Reviewed  POC SARS CORONAVIRUS 2 AG -  ED - Abnormal; Notable for the following components:      Result Value   SARS Coronavirus 2 Ag Positive (*)    All other components within normal limits    EKG   Radiology No results found.  Procedures Procedures (including critical care time)  Medications Ordered in UC Medications - No data to display  Initial Impression / Assessment and Plan / UC Course  I have reviewed the triage vital signs and the nursing notes.  Pertinent labs & imaging results that were available during my care of the patient were reviewed by me and considered in my medical decision making (see chart for details).     Patient is overall well-appearing.  Vitals are stable.  Congestion and rhinorrhea present, erythema and PND noted to posterior oropharynx.  Lungs clear bilaterally on auscultation.  COVID testing positive.  Patient is outside the 5-day window for Paxlovid treatment.  Prescribed Tessalon  and Promethazine  DM as needed for cough.  Discussed over-the-counter medications as needed for symptoms.  Provided patient with work note as requested.  Discussed follow-up, return, and strict ER precautions. Final Clinical Impressions(s) / UC Diagnoses   Final diagnoses:  Generalized body aches  Acute cough  COVID-19     Discharge Instructions      You tested positive for COVID today. You can take Tessalon  every 8  hours as needed for cough. You can take Promethazine  DM cough syrup at bedtime as needed for cough.  This will make you drowsy as they do not drive, work, or drink alcohol while taking this. You can take over-the-counter Mucinex as  needed for cough and congestion as well. Alternate between Tylenol and ibuprofen as needed for any pain or fever. Make sure you are staying hydrated and getting plenty of rest. Follow-up with your primary care provider or return here as needed. If you develop difficulty breathing, chest pain, severe weakness, or passing out please seek immediate medical treatment in the emergency department.     ED Prescriptions     Medication Sig Dispense Auth. Provider   benzonatate  (TESSALON ) 100 MG capsule Take 1 capsule (100 mg total) by mouth every 8 (eight) hours. 21 capsule Johnie Flaming A, NP   promethazine -dextromethorphan (PROMETHAZINE -DM) 6.25-15 MG/5ML syrup Take 5 mLs by mouth at bedtime as needed for cough. 118 mL Johnie Flaming A, NP      PDMP not reviewed this encounter.   Johnie Flaming A, NP 10/26/23 1600

## 2023-10-26 NOTE — Discharge Instructions (Signed)
 You tested positive for COVID today. You can take Tessalon  every 8 hours as needed for cough. You can take Promethazine  DM cough syrup at bedtime as needed for cough.  This will make you drowsy as they do not drive, work, or drink alcohol while taking this. You can take over-the-counter Mucinex as needed for cough and congestion as well. Alternate between Tylenol and ibuprofen as needed for any pain or fever. Make sure you are staying hydrated and getting plenty of rest. Follow-up with your primary care provider or return here as needed. If you develop difficulty breathing, chest pain, severe weakness, or passing out please seek immediate medical treatment in the emergency department.

## 2023-10-30 ENCOUNTER — Ambulatory Visit (HOSPITAL_COMMUNITY)

## 2023-10-31 ENCOUNTER — Encounter (HOSPITAL_COMMUNITY): Payer: Self-pay

## 2023-10-31 ENCOUNTER — Ambulatory Visit (HOSPITAL_COMMUNITY)
Admission: RE | Admit: 2023-10-31 | Discharge: 2023-10-31 | Disposition: A | Discharge status: Home / Self Care | Source: Ambulatory Visit

## 2023-10-31 VITALS — BP 101/60 | HR 53 | Temp 98.1°F | Resp 17

## 2023-10-31 DIAGNOSIS — U071 COVID-19: Secondary | ICD-10-CM

## 2023-10-31 NOTE — ED Provider Notes (Signed)
 [facility]    CSN: [id number] Arrival date & time: 10/31/23  1258      History   Chief Complaint Chief Complaint  Patient presents with   Letter for School/Work    HPI Jill Powell is a 48 y.o. female.   Patient presents to clinic with ongoing symptoms of COVID-19.  She continues to be a little congested.  Reports she is still a little short of breath, but this is much improved over the last few days.  Has been taking Tessalon  and promethazine  cough syrup, this has been helping.  Has not had any fever or chills.    The history is provided by the patient and medical records.    History reviewed. No pertinent past medical history.  Patient Active Problem List   Diagnosis Date Noted   Viral URI with cough 04/29/2018    Past Surgical History:  Procedure Laterality Date   APPENDECTOMY     TUBAL LIGATION      OB History   No obstetric history on file.      Home Medications    Prior to Admission medications   Medication Sig Start Date End Date Taking? Authorizing Provider  benzonatate  (TESSALON ) 100 MG capsule Take 1 capsule (100 mg total) by mouth every 8 (eight) hours. 10/26/23   Johnie Flaming A, NP  promethazine -dextromethorphan (PROMETHAZINE -DM) 6.25-15 MG/5ML syrup Take 5 mLs by mouth at bedtime as needed for cough. 10/26/23   Johnie Flaming LABOR, NP    Family History Family History  Problem Relation Age of Onset   Lung cancer Mother     Social History Social History   Tobacco Use   Smoking status: Every Day    Current packs/day: 1.00    Types: Cigarettes   Smokeless tobacco: Never  Vaping Use   Vaping status: Never Used  Substance Use Topics   Alcohol use: No   Drug use: Never     Allergies   Patient has no known allergies.   Review of Systems Review of Systems  Per HPI  Physical Exam Triage Vital Signs ED Triage Vitals [10/31/23 1310]  Encounter Vitals Group     BP 101/60     Girls Systolic BP Percentile      Girls  Diastolic BP Percentile      Boys Systolic BP Percentile      Boys Diastolic BP Percentile      Pulse Rate (!) 53     Resp 17     Temp 98.1 F (36.7 C)     Temp Source Oral     SpO2 97 %     Weight      Height      Head Circumference      Peak Flow      Pain Score 0     Pain Loc      Pain Education      Exclude from Growth Chart    No data found.  Updated Vital Signs BP 101/60 (BP Location: Left Arm)   Pulse (!) 53   Temp 98.1 F (36.7 C) (Oral)   Resp 17   LMP 09/25/2020 (Approximate)   SpO2 97%   Visual Acuity Right Eye Distance:   Left Eye Distance:   Bilateral Distance:    Right Eye Near:   Left Eye Near:    Bilateral Near:     Physical Exam Vitals and nursing note reviewed.  Constitutional:      Appearance: Normal appearance.  HENT:  Head: Normocephalic and atraumatic.     Right Ear: External ear normal.     Left Ear: External ear normal.     Nose: Nose normal.     Mouth/Throat:     Mouth: Mucous membranes are moist.  Eyes:     Conjunctiva/sclera: Conjunctivae normal.  Cardiovascular:     Rate and Rhythm: Normal rate and regular rhythm.     Heart sounds: Normal heart sounds. No murmur heard. Pulmonary:     Effort: Pulmonary effort is normal. No respiratory distress.     Breath sounds: Normal breath sounds.  Skin:    General: Skin is warm and dry.  Neurological:     General: No focal deficit present.     Mental Status: She is alert and oriented to person, place, and time.  Psychiatric:        Mood and Affect: Mood normal.        Behavior: Behavior normal.      UC Treatments / Results  Labs (all labs ordered are listed, but only abnormal results are displayed) Labs Reviewed - No data to display  EKG   Radiology No results found.  Procedures Procedures (including critical care time)  Medications Ordered in UC Medications - No data to display  Initial Impression / Assessment and Plan / UC Course  I have reviewed the triage  vital signs and the nursing notes.  Pertinent labs & imaging results that were available during my care of the patient were reviewed by me and considered in my medical decision making (see chart for details).  Vitals and triage reviewed, patient is hemodynamically stable.  Lungs vesicular, heart with regular rate and rhythm.  Overall, patient appears to be recovering from COVID-19.  Work note provided.  Symptomatic management discussed.  No questions at this time.    Final Clinical Impressions(s) / UC Diagnoses   Final diagnoses:  COVID-19 virus infection     Discharge Instructions      Continue with the Tessalon  Perles every 8 hours as needed.  You can take the promethazine  syrup at bedtime as needed, this may cause drowsiness or sedation.  Alternate between 500 mg of Tylenol and 600 mg of ibuprofen every 4-6 hours to help with fever, body aches or chills.  1200 mg of Mucinex daily can help loosen up congestion in addition to at least 64 ounces of water.  Return to clinic for new or urgent symptoms.    ED Prescriptions   None    PDMP not reviewed this encounter.   Dreama Curly SAILOR, FNP 10/31/23 1348

## 2023-10-31 NOTE — Discharge Instructions (Addendum)
 Continue with the Tessalon  Perles every 8 hours as needed.  You can take the promethazine  syrup at bedtime as needed, this may cause drowsiness or sedation.  Alternate between 500 mg of Tylenol and 600 mg of ibuprofen every 4-6 hours to help with fever, body aches or chills.  1200 mg of Mucinex daily can help loosen up congestion in addition to at least 64 ounces of water.  Return to clinic for new or urgent symptoms.

## 2023-10-31 NOTE — ED Triage Notes (Signed)
 Pt reports had covid and was sen here on 9/14. Was told could go back to work on 9/17 but wasn't feeling myself. Pt needing note to return to work.

## 2023-11-09 ENCOUNTER — Ambulatory Visit (HOSPITAL_COMMUNITY): Payer: Self-pay
